# Patient Record
Sex: Female | Born: 1955 | Hispanic: Yes | Marital: Married | Smoking: Never smoker
Health system: Southern US, Community
[De-identification: ages and names within clinical notes are randomized; demographics above are authoritative.]

## PROBLEM LIST (undated history)

## (undated) DIAGNOSIS — M1990 Unspecified osteoarthritis, unspecified site: Secondary | ICD-10-CM

## (undated) DIAGNOSIS — M069 Rheumatoid arthritis, unspecified: Secondary | ICD-10-CM

## (undated) DIAGNOSIS — H547 Unspecified visual loss: Secondary | ICD-10-CM

## (undated) HISTORY — PX: BREAST BIOPSY: SHX20

## (undated) HISTORY — PX: TUBAL LIGATION: SHX77

## (undated) HISTORY — PX: CHOLECYSTECTOMY: SHX55

---

## 2005-07-09 ENCOUNTER — Emergency Department: Payer: Self-pay | Admitting: Emergency Medicine

## 2005-07-10 ENCOUNTER — Ambulatory Visit: Payer: Self-pay | Admitting: Emergency Medicine

## 2005-07-25 ENCOUNTER — Ambulatory Visit: Payer: Self-pay | Admitting: General Surgery

## 2007-04-03 ENCOUNTER — Emergency Department: Payer: Self-pay | Admitting: Emergency Medicine

## 2007-04-04 ENCOUNTER — Other Ambulatory Visit: Payer: Self-pay

## 2008-02-26 ENCOUNTER — Ambulatory Visit: Payer: Self-pay | Admitting: Family Medicine

## 2008-03-04 ENCOUNTER — Ambulatory Visit: Payer: Self-pay | Admitting: Family Medicine

## 2009-05-25 ENCOUNTER — Ambulatory Visit: Payer: Self-pay | Admitting: Family Medicine

## 2011-10-19 ENCOUNTER — Ambulatory Visit: Payer: Self-pay | Admitting: Family Medicine

## 2012-12-24 ENCOUNTER — Ambulatory Visit: Payer: Self-pay | Admitting: Family Medicine

## 2014-04-01 ENCOUNTER — Ambulatory Visit: Payer: Self-pay | Admitting: Family Medicine

## 2015-04-20 ENCOUNTER — Other Ambulatory Visit: Payer: Self-pay | Admitting: Family Medicine

## 2015-04-20 DIAGNOSIS — Z1239 Encounter for other screening for malignant neoplasm of breast: Secondary | ICD-10-CM

## 2015-05-17 ENCOUNTER — Encounter: Payer: Self-pay | Admitting: Emergency Medicine

## 2015-05-17 ENCOUNTER — Emergency Department: Payer: Medicaid Other

## 2015-05-17 ENCOUNTER — Emergency Department
Admission: EM | Admit: 2015-05-17 | Discharge: 2015-05-17 | Disposition: A | Discharge status: Home / Self Care | Payer: Medicaid Other | Attending: Emergency Medicine | Admitting: Emergency Medicine

## 2015-05-17 DIAGNOSIS — R42 Dizziness and giddiness: Secondary | ICD-10-CM | POA: Diagnosis present

## 2015-05-17 DIAGNOSIS — J069 Acute upper respiratory infection, unspecified: Secondary | ICD-10-CM

## 2015-05-17 HISTORY — DX: Unspecified osteoarthritis, unspecified site: M19.90

## 2015-05-17 HISTORY — DX: Unspecified visual loss: H54.7

## 2015-05-17 LAB — URINALYSIS COMPLETE WITH MICROSCOPIC (ARMC ONLY)
BILIRUBIN URINE: NEGATIVE
Bacteria, UA: NONE SEEN
Glucose, UA: NEGATIVE mg/dL
HGB URINE DIPSTICK: NEGATIVE
KETONES UR: NEGATIVE mg/dL
Nitrite: NEGATIVE
PH: 5 (ref 5.0–8.0)
Protein, ur: NEGATIVE mg/dL
Specific Gravity, Urine: 1.01 (ref 1.005–1.030)

## 2015-05-17 LAB — LIPASE, BLOOD: LIPASE: 31 U/L (ref 11–51)

## 2015-05-17 LAB — COMPREHENSIVE METABOLIC PANEL
ALBUMIN: 4.3 g/dL (ref 3.5–5.0)
ALT: 19 U/L (ref 14–54)
ANION GAP: 7 (ref 5–15)
AST: 20 U/L (ref 15–41)
Alkaline Phosphatase: 83 U/L (ref 38–126)
BUN: 18 mg/dL (ref 6–20)
CHLORIDE: 108 mmol/L (ref 101–111)
CO2: 25 mmol/L (ref 22–32)
CREATININE: 0.49 mg/dL (ref 0.44–1.00)
Calcium: 9 mg/dL (ref 8.9–10.3)
Glucose, Bld: 152 mg/dL — ABNORMAL HIGH (ref 65–99)
Potassium: 3.6 mmol/L (ref 3.5–5.1)
SODIUM: 140 mmol/L (ref 135–145)
Total Bilirubin: 0.5 mg/dL (ref 0.3–1.2)
Total Protein: 7.7 g/dL (ref 6.5–8.1)

## 2015-05-17 LAB — CBC
HCT: 42.4 % (ref 35.0–47.0)
HEMOGLOBIN: 14.5 g/dL (ref 12.0–16.0)
MCH: 32.1 pg (ref 26.0–34.0)
MCHC: 34.1 g/dL (ref 32.0–36.0)
MCV: 94.3 fL (ref 80.0–100.0)
PLATELETS: 296 10*3/uL (ref 150–440)
RBC: 4.5 MIL/uL (ref 3.80–5.20)
RDW: 13.2 % (ref 11.5–14.5)
WBC: 7.6 10*3/uL (ref 3.6–11.0)

## 2015-05-17 MED ORDER — MECLIZINE HCL 32 MG PO TABS: 32.0000 mg | ORAL_TABLET | Freq: Three times a day (TID) | ORAL | Status: AC | PRN

## 2015-05-17 MED ORDER — MECLIZINE HCL 25 MG PO TABS
25.0000 mg | ORAL_TABLET | Freq: Once | ORAL | Status: AC
  Administered 2015-05-17: 25 mg via ORAL
  Filled 2015-05-17: qty 1

## 2015-05-17 MED ORDER — ONDANSETRON 4 MG PO TBDP
4.0000 mg | ORAL_TABLET | Freq: Once | ORAL | Status: AC | PRN
  Administered 2015-05-17: 4 mg via ORAL

## 2015-05-17 MED ORDER — ONDANSETRON HCL 4 MG/2ML IJ SOLN
4.0000 mg | Freq: Once | INTRAMUSCULAR | Status: AC
  Administered 2015-05-17: 4 mg via INTRAVENOUS
  Filled 2015-05-17: qty 2

## 2015-05-17 MED ORDER — ONDANSETRON 4 MG PO TBDP
ORAL_TABLET | ORAL | Status: AC
  Filled 2015-05-17: qty 1

## 2015-05-17 MED ORDER — SODIUM CHLORIDE 0.9 % IV BOLUS (SEPSIS)
1000.0000 mL | Freq: Once | INTRAVENOUS | Status: AC
  Administered 2015-05-17: 1000 mL via INTRAVENOUS

## 2015-05-17 MED ORDER — ONDANSETRON HCL 4 MG PO TABS: 4.0000 mg | ORAL_TABLET | Freq: Every day | ORAL | Status: AC | PRN

## 2015-05-17 NOTE — ED Notes (Signed)
Patient states that she is having nausea, dizziness, and "heaviness" of her body for the past 5 days, symptoms were worse on Saturday. Patient feels a "ball" forming in the epigastric area and she starts to feel nauseated.

## 2015-05-17 NOTE — ED Notes (Signed)
Patient given 12 oz of water and crackers.

## 2015-05-17 NOTE — ED Notes (Signed)
Patient able to ambulate from room to nurses station and back with room without trouble. Patient reports minimal dizziness.   Patient denies nausea since PO intake.

## 2015-05-17 NOTE — ED Provider Notes (Addendum)
[facility] Emergency Department Provider Note  ____________________________________________   I have reviewed the triage vital signs and the nursing notes.   HISTORY  Chief Complaint Dizziness    HPI Florence Yeung Ballowe is a 59 y.o. female with a history of recurrent vertigo in her life, 3 or 4 different episodes has what she describes as vertigo. She states she has a sensation of spinning which is been going on for the last several days. She denies any fever or chills or focal numbness or weakness. Patient is blind at baseline. She states her stomach has been feeling somewhat upset and she feels generally weak. She states that the nausea and vertigo symptoms have been waxing and waning they are worse when she changes position in a hurry. She denies any fever or chills. She does have a cough and the cough she started several days before the vertigo. She denies production of mucus. Positive rhinorrhea denies fever as been able to eat and drink normally including right before she came in, denies any constipation or diarrhea or change in her bowel habits and has not actually vomited.  Past Medical History  Diagnosis Date  . Arthritis   . Blind     There are no active problems to display for this patient.   Past Surgical History  Procedure Laterality Date  . Cholecystectomy    . Tubal ligation      No current outpatient prescriptions on file.  Allergies Review of patient's allergies indicates no known allergies.  No family history on file.  Social History Social History  Substance Use Topics  . Smoking status: Not on file  . Smokeless tobacco: Not on file  . Alcohol Use: Not on file    Review of Systems Constitutional: No fever/chills Eyes: No visual changes. ENT: No sore throat. No stiff neck no neck pain Cardiovascular: Denies chest pain. Respiratory: Denies shortness of breath. Gastrointestinal:   no vomiting.  No diarrhea.  No  constipation. Genitourinary: Negative for dysuria. Musculoskeletal: Negative lower extremity swelling Skin: Negative for rash. Neurological: Negative for headaches, focal weakness or numbness. 10-point ROS otherwise negative.  ____________________________________________   PHYSICAL EXAM:  VITAL SIGNS: ED Triage Vitals  Enc Vitals Group     BP 05/17/15 1914 140/73 mmHg     Pulse Rate 05/17/15 1914 78     Resp 05/17/15 1914 22     Temp 05/17/15 1914 98.6 F (37 C)     Temp Source 05/17/15 1914 Oral     SpO2 05/17/15 1914 96 %     Weight 05/17/15 1914 187 lb (84.823 kg)     Height --      Head Cir --      Peak Flow --      Pain Score 05/17/15 1915 3     Pain Loc --      Pain Edu? --      Excl. in GC? --     Constitutional: Alert and oriented. Well appearing and in no acute distress. Eyes: Conjunctivae are normal. . patient will not follow my finger given complete A saline blindness and I cannot evaluate for nystagmus. Head: Atraumatic. Nose: No congestion/rhinnorhea. Mouth/Throat: Mucous membranes are moist.  Oropharynx non-erythematous. Neck: No stridor.   Nontender with no meningismus Cardiovascular: Normal rate, regular rhythm. Grossly normal heart sounds.  Good peripheral circulation. Respiratory: Normal respiratory effort.  No retractions. Lungs CTAB. Abdominal: Soft and nontender. No distention. No guarding no rebound Back:  There is no focal  tenderness or step off there is no midline tenderness there are no lesions noted. there is no CVA tenderness Musculoskeletal: No lower extremity tenderness. No joint effusions, no DVT signs strong distal pulses no edema Neurologic:  Normal speech and language. No gross focal neurologic deficits Appreciated I cannot evaluate for finger to nose, however rapid alternating hand movements and heel-to-shin appear normal. There is no pronator drift and cranial nerves are grossly intact.  Skin:  Skin is warm, dry and intact. No rash  noted. Psychiatric: Mood and affect are normal. Speech and behavior are normal.  ____________________________________________   LABS (all labs ordered are listed, but only abnormal results are displayed)  Labs Reviewed  COMPREHENSIVE METABOLIC PANEL - Abnormal; Notable for the following:    Glucose, Bld 152 (*)    All other components within normal limits  URINALYSIS COMPLETEWITH MICROSCOPIC ([facility] ONLY) - Abnormal; Notable for the following:    Color, Urine STRAW (*)    APPearance CLEAR (*)    Leukocytes, UA 1+ (*)    Squamous Epithelial / LPF 0-5 (*)    All other components within normal limits  LIPASE, BLOOD  CBC   ____________________________________________  EKG  I personally interpreted any EKGs ordered by me or triage  ____________________________________________  RADIOLOGY  I reviewed any imaging ordered by me or triage that were performed during my shift ____________________________________________   PROCEDURES  Procedure(s) performed: None  Critical Care performed: None  ____________________________________________   INITIAL IMPRESSION / ASSESSMENT AND PLAN / ED COURSE  Pertinent labs & imaging results that were available during my care of the patient were reviewed by me and considered in my medical decision making (see chart for details).   Miss Bradly BienenstockMartinez suffered from recurrent vertigo many times in her life and she is having attack of vertigo at the same time she is having a URI symptoms. Her TMs at this time are normal. I do suspect this is most likely an inner ear issue given the recurrent nature of her symptoms and the otherwise absence of cerebellar symptoms however she has not had imaging she states we'll obtain a CT scan of her head as a precaution she has had symptoms for 4 days. I will give her Antivert and fluids and nausea medication and we will reassess. Given her cough I'll also obtain a chest x-ray although her oxygen saturation when I'm in  the room is 98% and her lungs sound clear at this time. If I can get her symptomatically feeling better with a negative CT is my hope he can get her home. Blood work is reassuring.   ----------------------------------------- 9:32 PM on 05/17/2015 -----------------------------------------  The patient states she feels much better. She has much less dizzy. She would like to try by mouth and ambulation  ----------------------------------------- 9:53 PM on 05/17/2015 -----------------------------------------  Pt ambulates with no difficulty. Eager to go home. Return precautions and f/u stressed in spanish and understood.   ___________________________________________   FINAL CLINICAL IMPRESSION(S) / ED DIAGNOSES  Final diagnoses:  None     Jeanmarie PlantJames A Rula [place], MD 05/17/15 2047  Jeanmarie PlantJames A Hansen Carino, MD 05/17/15 2133  Jeanmarie PlantJames A Lorain Fettes, MD 05/17/15 2153

## 2015-05-17 NOTE — ED Notes (Addendum)
pt reports woke up on thursday and when getting out of the bed the room was spinning and nausea, she went to her pcp last month with back pain and chest pain and was prescribed fluloxetine she took it on Wednesday night and states current symptoms started the following morning, was the first time taking this new RX, pt also states cramping in stomach and makes gives her nausea

## 2015-05-31 ENCOUNTER — Ambulatory Visit
Admission: RE | Admit: 2015-05-31 | Discharge: 2015-05-31 | Disposition: A | Discharge status: Home / Self Care | Payer: Medicaid Other | Source: Ambulatory Visit | Attending: Family Medicine | Admitting: Family Medicine

## 2015-05-31 DIAGNOSIS — Z1231 Encounter for screening mammogram for malignant neoplasm of breast: Secondary | ICD-10-CM | POA: Insufficient documentation

## 2015-05-31 DIAGNOSIS — Z1239 Encounter for other screening for malignant neoplasm of breast: Secondary | ICD-10-CM

## 2015-06-19 ENCOUNTER — Emergency Department
Admission: EM | Admit: 2015-06-19 | Discharge: 2015-06-19 | Disposition: A | Discharge status: Home / Self Care | Payer: Medicaid Other | Attending: Emergency Medicine | Admitting: Emergency Medicine

## 2015-06-19 ENCOUNTER — Emergency Department: Payer: Medicaid Other

## 2015-06-19 ENCOUNTER — Encounter: Payer: Self-pay | Admitting: Radiology

## 2015-06-19 DIAGNOSIS — H9203 Otalgia, bilateral: Secondary | ICD-10-CM | POA: Diagnosis not present

## 2015-06-19 DIAGNOSIS — R6884 Jaw pain: Secondary | ICD-10-CM | POA: Diagnosis present

## 2015-06-19 DIAGNOSIS — K0889 Other specified disorders of teeth and supporting structures: Secondary | ICD-10-CM | POA: Diagnosis not present

## 2015-06-19 DIAGNOSIS — K047 Periapical abscess without sinus: Secondary | ICD-10-CM | POA: Diagnosis not present

## 2015-06-19 DIAGNOSIS — J029 Acute pharyngitis, unspecified: Secondary | ICD-10-CM | POA: Diagnosis not present

## 2015-06-19 LAB — COMPREHENSIVE METABOLIC PANEL
ALBUMIN: 3.8 g/dL (ref 3.5–5.0)
ALT: 21 U/L (ref 14–54)
ANION GAP: 6 (ref 5–15)
AST: 24 U/L (ref 15–41)
Alkaline Phosphatase: 81 U/L (ref 38–126)
BUN: 13 mg/dL (ref 6–20)
CHLORIDE: 107 mmol/L (ref 101–111)
CO2: 27 mmol/L (ref 22–32)
Calcium: 9 mg/dL (ref 8.9–10.3)
Creatinine, Ser: 0.72 mg/dL (ref 0.44–1.00)
GFR calc Af Amer: >60 mL/min (ref >60)
GFR calc non Af Amer: >60 mL/min (ref >60)
GLUCOSE: 110 mg/dL — AB (ref 65–99)
POTASSIUM: 4 mmol/L (ref 3.5–5.1)
Sodium: 140 mmol/L (ref 135–145)
Total Bilirubin: 0.7 mg/dL (ref 0.3–1.2)
Total Protein: 7.6 g/dL (ref 6.5–8.1)

## 2015-06-19 LAB — CBC WITH DIFFERENTIAL/PLATELET
BASOS ABS: 0 10*3/uL (ref 0–0.1)
Basophils Relative: 0 %
EOS PCT: 3 %
Eosinophils Absolute: 0.3 10*3/uL (ref 0–0.7)
HEMATOCRIT: 39.1 % (ref 35.0–47.0)
Hemoglobin: 13.1 g/dL (ref 12.0–16.0)
LYMPHS ABS: 1.4 10*3/uL (ref 1.0–3.6)
LYMPHS PCT: 17 %
MCH: 31.2 pg (ref 26.0–34.0)
MCHC: 33.5 g/dL (ref 32.0–36.0)
MCV: 93.1 fL (ref 80.0–100.0)
Monocytes Absolute: 0.5 10*3/uL (ref 0.2–0.9)
Monocytes Relative: 6 %
NEUTROS ABS: 6.2 10*3/uL (ref 1.4–6.5)
Neutrophils Relative %: 74 %
PLATELETS: 279 10*3/uL (ref 150–440)
RBC: 4.2 MIL/uL (ref 3.80–5.20)
RDW: 13 % (ref 11.5–14.5)
WBC: 8.4 10*3/uL (ref 3.6–11.0)

## 2015-06-19 LAB — TSH: TSH: 2.795 u[IU]/mL (ref 0.350–4.500)

## 2015-06-19 MED ORDER — HYDROMORPHONE HCL 1 MG/ML IJ SOLN
0.5000 mg | Freq: Once | INTRAMUSCULAR | Status: AC
  Administered 2015-06-19: 0.5 mg via INTRAVENOUS
  Filled 2015-06-19: qty 1

## 2015-06-19 MED ORDER — IOHEXOL 300 MG/ML  SOLN
75.0000 mL | Freq: Once | INTRAMUSCULAR | Status: AC | PRN
  Administered 2015-06-19: 75 mL via INTRAVENOUS
  Filled 2015-06-19: qty 75

## 2015-06-19 MED ORDER — AMOXICILLIN 500 MG PO CAPS
500.0000 mg | ORAL_CAPSULE | Freq: Once | ORAL | Status: AC
  Administered 2015-06-19: 500 mg via ORAL
  Filled 2015-06-19: qty 1

## 2015-06-19 MED ORDER — OXYCODONE-ACETAMINOPHEN 5-325 MG PO TABS: 1.0000 | ORAL_TABLET | ORAL | Status: AC | PRN

## 2015-06-19 MED ORDER — AMOXICILLIN 500 MG PO TABS: 500.0000 mg | ORAL_TABLET | Freq: Two times a day (BID) | ORAL | Status: AC

## 2015-06-19 NOTE — ED Provider Notes (Signed)
[facility] Emergency Department Provider Note  ____________________________________________  Time seen: Approximately 4:43 PM  I have reviewed the triage vital signs and the nursing notes. History and physical exam are obtained via interpreter, Trudy.   HISTORY  Chief Complaint Sore Throat    HPI Morgan Carroll is a 60 y.o. female presents for evaluation of severe throat and bilateral ear pain. Patient states that her face history swelling at the wrist or swelling her jaws or swelling and pain is 10 over 10. No relief with anything over-the-counter symptoms progressively getting worse. This started gradually yesterday.Patient is legally blind, speaks no [place].  Past Medical History  Diagnosis Date  . Arthritis   . Blind     There are no active problems to display for this patient.   Past Surgical History  Procedure Laterality Date  . Cholecystectomy    . Tubal ligation      Current Outpatient Rx  Name  Route  Sig  Dispense  Refill  . amoxicillin (AMOXIL) 500 MG tablet   Oral   Take 1 tablet (500 mg total) by mouth 2 (two) times daily.   20 tablet   0   . meclizine (ANTIVERT) 32 MG tablet   Oral   Take 1 tablet (32 mg total) by mouth 3 (three) times daily as needed.   30 tablet   0   . ondansetron (ZOFRAN) 4 MG tablet   Oral   Take 1 tablet (4 mg total) by mouth daily as needed for nausea or vomiting.   10 tablet   0   . oxyCODONE-acetaminophen (ROXICET) 5-325 MG tablet   Oral   Take 1-2 tablets by mouth every 4 (four) hours as needed for severe pain.   15 tablet   0     Allergies Review of patient's allergies indicates no known allergies.  No family history on file.  Social History Social History  Substance Use Topics  . Smoking status: None  . Smokeless tobacco: None  . Alcohol Use: None    Review of Systems Constitutional: No fever/chills Eyes: No visual changes. ENT: Positive for sore throat more  positive for jaw pain and dental pain. Recent dental pain. Cardiovascular: Denies chest pain. Respiratory: Denies shortness of breath. Gastrointestinal: No abdominal pain.  No nausea, no vomiting.  No diarrhea.  No constipation. Genitourinary: Negative for dysuria. Musculoskeletal: Negative for back pain. Skin: Negative for rash. Neurological: Negative for headaches, focal weakness or numbness.  10-point ROS otherwise negative.  ____________________________________________   PHYSICAL EXAM:  VITAL SIGNS: ED Triage Vitals  Enc Vitals Group     BP 06/19/15 1553 133/57 mmHg     Pulse Rate 06/19/15 1553 94     Resp 06/19/15 1553 16     Temp 06/19/15 1553 99.6 F (37.6 C)     Temp Source 06/19/15 1553 Oral     SpO2 06/19/15 1553 96 %     Weight 06/19/15 1553 187 lb (84.823 kg)     Height --      Head Cir --      Peak Flow --      Pain Score 06/19/15 1602 9     Pain Loc --      Pain Edu? --      Excl. in GC? --     Constitutional: Alert and oriented. Well appearing and in no acute distress. Eyes: Conjunctivae are normal. PERRL. EOMI. Head: Atraumatic. Nose: No congestion/rhinnorhea. Mouth/Throat: Mucous membranes are moist.  Oropharynx  non-erythematous. Increased edema and tenderness to the bilateral jaw and inferior neck and around the thyroid gland. Neck: No stridor. Positive subcutaneous mass around the thyroid gland measuring approximately 3 cm.   Cardiovascular: Normal rate, regular rhythm. Grossly normal heart sounds.  Good peripheral circulation. Respiratory: Normal respiratory effort.  No retractions. Lungs CTAB. Musculoskeletal: No lower extremity tenderness nor edema.  No joint effusions. Neurologic:  Normal speech and language. No gross focal neurologic deficits are appreciated. No gait instability. Skin:  Skin is warm, dry and intact. No rash noted. Psychiatric: Mood and affect are normal. Speech and behavior are  normal.  ____________________________________________   LABS (all labs ordered are listed, but only abnormal results are displayed)  Labs Reviewed  COMPREHENSIVE METABOLIC PANEL - Abnormal; Notable for the following:    Glucose, Bld 110 (*)    All other components within normal limits  CBC WITH DIFFERENTIAL/PLATELET  TSH   RADIOLOGY No acute abscess noted.  ____________________________________________   PROCEDURES  Procedure(s) performed: None  Critical Care performed: No  ____________________________________________   INITIAL IMPRESSION / ASSESSMENT AND PLAN / ED COURSE  Pertinent labs & imaging results that were available during my care of the patient were reviewed by me and considered in my medical decision making (see chart for details).  Dental pain with infection. Rx given for amoxicillin 500 mg 3 times a day, Percocet 5/325. Patient follow-up with dentist, list provided. Patient voices no other emergency medical complaints at this time. ____________________________________________   FINAL CLINICAL IMPRESSION(S) / ED DIAGNOSES  Final diagnoses:  Tooth ache  Dental infection      Evangeline Dakin, PA-C 06/19/15 1924  Jene Every, MD 06/19/15 2218

## 2015-06-19 NOTE — ED Notes (Signed)
Pt discharged to home.  Family member driving. Interpreter present for discharge instructions.   Discharge instructions reviewed.  Verbalized understanding.  No questions or concerns at this time.  Teach back verified.  Pt in NAD.  No items left in ED.

## 2015-06-19 NOTE — ED Notes (Signed)
Pt reports sore throat and bilateral ear pain  for pat couple of days. Was seen by her PCP yesterday and was told it was a virus but given Tylenol and Doxycycline prescription that she has with her. Pt reports taken one does so far was told to come to the ER if the pain has gotten worse which it has per pt.

## 2015-06-19 NOTE — Discharge Instructions (Signed)
Dolor dental (Dental Pain) Las causas del dolor dental pueden ser muchas, entre ellas, las siguientes:  Caries. La caries hace que el nervio del diente est expuesto al aire y a las temperaturas calientes o fras. Esto puede causar dolor o molestias.  Infeccin o absceso. Un absceso dental es un rea llena de pus infectado debido a una infeccin bacteriana en la parte interna del diente (pulpa). Por lo general, se produce en el extremo de la raz del diente.  Lesiones.  Una razn desconocida (idioptica). El dolor puede ser leve o intenso. El dolor quizs aparezca solamente en estas ocasiones:  Al masticar.  Al estar expuesto a una temperatura caliente o fra.  Al comer alimentos o tomar bebidas con azcar, por ejemplo, gaseosas o caramelos. El dolor tambin puede ser constante. INSTRUCCIONES PARA EL CUIDADO EN EL HOGAR Controle el dolor dental a fin de Public house manager cambio. Las siguientes medidas pueden servir para Paramedic cualquier molestia que est sintiendo:  Tome los medicamentos solamente como se lo haya indicado el dentista.  Si le recetaron antibiticos, asegrese de terminarlos, incluso si comienza a Actor.  Concurra a todas las visitas de control como se lo haya indicado el dentista. Esto es importante.  No se aplique calor en la parte externa de la cara.  Si el dentista se lo ha indicado, enjuguese la boca o haga grgaras con agua salada. Esto ayuda a Engineer, materials y Building services engineer.  Puede preparar agua salada agregando  de cucharadita de sal en 1taza de agua templada.  Aplquese hielo sobre la zona dolorida de la cara:  Ponga el hielo en una bolsa plstica.  Coloque una toalla entre la piel y la bolsa de hielo.  Coloque el hielo durante , 2 a 3veces por Futures trader.  Evite los alimentos o las bebidas que le causen [place], como los siguientes:  [place] o bebidas muy calientes o muy fros.  Alimentos o bebidas dulces o con  azcar. SOLICITE ATENCIN MDICA SI:  El dolor no se alivia con los [employer].  Los sntomas empeoran.  Aparecen nuevos sntomas. SOLICITE ATENCIN MDICA DE INMEDIATO SI:  No puede abrir la boca.  Tiene problemas para respirar o tragar.  Tiene fiebre.  Tiene hinchazn en la cara, el cuello o la mandbula.  OPTIONS FOR DENTAL FOLLOW UP CARE   Department of Health and Human Services - Local Safety Net Dental Clinics [email or web address].[email or web address].htm   [facility] [phone])  [facility] [phone])  [place] [phone] ext [phone])  [facility] [phone])  [facility] [phone]) This clinic caters to the indigent population and is on a lottery system. Location: [employer] [facility], [employer], [address] [place] Clinic Hours: Wednesdays from 6pm - 9pm, patients seen by a lottery system. For dates, call or go to [email or web address] Services: Cleanings, fillings and simple extractions. Payment Options: DENTAL WORK IS FREE OF CHARGE. Bring proof of income or support. Best way to get seen: Arrive at 5:15 pm - this is a lottery, NOT first come/first serve, so arriving earlier will not increase your chances of being seen.     [facility] [phone] Select option 1 for emergencies   Location: [facility], [place], [address] [place] Clinic Hours: No walk-ins accepted - call the day before to schedule an appointment. Check in times are 9:30 am and 1:30 pm. Services: Simple extractions, temporary fillings, pulpectomy/pulp debridement, uncomplicated abscess drainage.  Payment Options: PAYMENT IS DUE AT THE TIME OF SERVICE.  Fee is usually $100-200, additional surgical procedures (e.g. abscess drainage) may be extra. Cash, checks, Visa/MasterCard accepted.  Can  file Medicaid if patient is covered for dental - patient should call case worker to check. No discount for [facility] patients. Best way to get seen: MUST call the day before and get onto the schedule. Can usually be seen the next 1-2 days. No walk-ins accepted.     [facility] [phone]   Location: [facility], [address], [place] Clinic Hours: M, W, Th, F 8am or 1:30pm, Tues 9a or 1:30 - first come/first served. Services: Simple extractions, temporary fillings, uncomplicated abscess drainage.  You do not need to be an [facility] resident. Payment Options: PAYMENT IS DUE AT THE TIME OF SERVICE. Dental insurance, otherwise sliding scale - bring proof of income or support. Depending on income and treatment needed, cost is usually $50-200. Best way to get seen: Arrive early as it is first come/first served.     [facility] [phone]   Location: [address] Clinic Hours: Mon-Thu 8a-5p Services: Most basic dental services including extractions and fillings. Payment Options: PAYMENT IS DUE AT THE TIME OF SERVICE. Sliding scale, up to 50% off - bring proof if income or support. Medicaid with dental option accepted. Best way to get seen: Call to schedule an appointment, can usually be seen within 2 weeks OR they will try to see walk-ins - show up at 8a or 2p (you may have to wait).     [facility] [phone] [place] RESIDENTS ONLY   Location: [facility], [address], [place], [place] [address] Clinic Hours: By appointment only. Monday - Thursday 8am-5pm, Friday 8am-12pm Services: Cleanings, fillings, extractions. Payment Options: PAYMENT IS DUE AT THE TIME OF SERVICE. Cash, Visa or MasterCard. Sliding scale - $30 minimum per service. Best way to get seen: Come in to office, complete packet and make an appointment - need proof  of income or support monies for each household member and proof of [facility] residence. Usually takes about a month to get in.     [facility] [phone]   Location: [address], [facility] Clinic Hours: Walk-in Urgent Care Dental Services are offered Monday-Friday mornings only. The numbers of emergencies accepted daily is limited to the number of providers available. Maximum 15 - Mondays, Wednesdays & Thursdays Maximum 10 - Tuesdays & Fridays Services: You do not need to be a [facility] resident to be seen for a dental emergency. Emergencies are defined as pain, swelling, abnormal bleeding, or dental trauma. Walkins will receive x-rays if needed. NOTE: Dental cleaning is not an emergency. Payment Options: PAYMENT IS DUE AT THE TIME OF SERVICE. Minimum co-pay is $40.00 for uninsured patients. Minimum co-pay is $3.00 for Medicaid with dental coverage. Dental Insurance is accepted and must be presented at time of visit. Medicare does not cover dental. Forms of payment: Cash, credit card, checks. Best way to get seen: If not previously registered with the clinic, walk-in dental registration begins at 7:15 am and is on a first come/first serve basis. If previously registered with the clinic, call to make an appointment.     [facility] [phone] [place] RESIDENTS ONLY   Location: [address], [place], [place] Clinic Hours: Mon-Thu 10a-2p Services: Extractions only! Payment Options: FREE (donations accepted) - bring  proof of income or support Best way to get seen: Call and schedule an appointment OR come at 8am on the 1st Monday of every month (except for holidays) when it is first come/first served.     [facility] [phone]   Location: [address] [place], [place] Clinic Hours: Friday mornings Services, Payment Options, Best way to get seen: Call for info   Esta informacin no tiene como fin  reemplazar el consejo del mdico. Asegrese de hacerle al mdico cualquier pregunta que tenga.   Document Released: 05/08/2005 Document Revised: 09/22/2014 Elsevier Interactive Patient Education [employer]

## 2016-07-31 ENCOUNTER — Encounter: Payer: Self-pay | Admitting: Emergency Medicine

## 2016-07-31 ENCOUNTER — Emergency Department: Payer: Self-pay

## 2016-07-31 ENCOUNTER — Emergency Department
Admission: EM | Admit: 2016-07-31 | Discharge: 2016-07-31 | Disposition: A | Discharge status: Home / Self Care | Payer: Self-pay | Attending: Emergency Medicine | Admitting: Emergency Medicine

## 2016-07-31 DIAGNOSIS — Y939 Activity, unspecified: Secondary | ICD-10-CM | POA: Insufficient documentation

## 2016-07-31 DIAGNOSIS — S40012A Contusion of left shoulder, initial encounter: Secondary | ICD-10-CM

## 2016-07-31 DIAGNOSIS — W19XXXA Unspecified fall, initial encounter: Secondary | ICD-10-CM | POA: Insufficient documentation

## 2016-07-31 DIAGNOSIS — M545 Low back pain, unspecified: Secondary | ICD-10-CM

## 2016-07-31 DIAGNOSIS — S8012XA Contusion of left lower leg, initial encounter: Secondary | ICD-10-CM

## 2016-07-31 DIAGNOSIS — Y999 Unspecified external cause status: Secondary | ICD-10-CM | POA: Insufficient documentation

## 2016-07-31 DIAGNOSIS — M25552 Pain in left hip: Secondary | ICD-10-CM | POA: Insufficient documentation

## 2016-07-31 DIAGNOSIS — Y929 Unspecified place or not applicable: Secondary | ICD-10-CM | POA: Insufficient documentation

## 2016-07-31 DIAGNOSIS — S9031XA Contusion of right foot, initial encounter: Secondary | ICD-10-CM

## 2016-07-31 DIAGNOSIS — M25551 Pain in right hip: Secondary | ICD-10-CM | POA: Insufficient documentation

## 2016-07-31 HISTORY — DX: Rheumatoid arthritis, unspecified: M06.9

## 2016-07-31 MED ORDER — MELOXICAM 15 MG PO TABS: 15.0000 mg | ORAL_TABLET | Freq: Every day | ORAL | 0 refills | Status: AC

## 2016-07-31 MED ORDER — KETOROLAC TROMETHAMINE 30 MG/ML IJ SOLN
30.0000 mg | Freq: Once | INTRAMUSCULAR | Status: AC
  Administered 2016-07-31: 30 mg via INTRAMUSCULAR
  Filled 2016-07-31: qty 1

## 2016-07-31 NOTE — ED Triage Notes (Signed)
Fell 15 days ago. Pain lower back and down L leg since.

## 2016-07-31 NOTE — ED Provider Notes (Signed)
[facility] Emergency Department Provider Note  ____________________________________________   First MD Initiated Contact with Patient 07/31/16 1409     (approximate)  I have reviewed the triage vital signs and the nursing notes.   HISTORY  Chief Complaint Leg Injury Per Spanish interpreter   HPI Morgan Carroll is a 61 y.o. female is here with complaint of multiple muscle aches and pains after a fall that occurred at home 15 days ago. Patient states that she was standing on a stool approximately 3 feet off the floor when she fell injuring her shoulder, low back, pelvis, left leg, right shoulder and right foot. Patient has been taking Aleve infrequently without any relief of her pain. She states she has not been seen by her PCP during that 15 days after her accident. She denies any head injury or loss of consciousness. Patient has continued to ambulate without assistance at home but states the pain is worse today. She denies any previous back injury or problems with her right shoulder. She denies any peptic ulcer disease or known allergies. [facility]. She rates her pain as an 8/10.   Past Medical History:  Diagnosis Date  . Arthritis   . Blind   . Blind   . Rheumatoid arthritis (HCC)     There are no active problems to display for this patient.   Past Surgical History:  Procedure Laterality Date  . CHOLECYSTECTOMY    . TUBAL LIGATION      Prior to Admission medications   Medication Sig Start Date End Date Taking? Authorizing Provider  amoxicillin (AMOXIL) 500 MG tablet Take 1 tablet (500 mg total) by mouth 2 (two) times daily. 06/19/15   Charmayne Sheerharles M Beers, PA-C  meclizine (ANTIVERT) 32 MG tablet Take 1 tablet (32 mg total) by mouth 3 (three) times daily as needed. 05/17/15   Jeanmarie PlantJames A McShane, MD  meloxicam (MOBIC) 15 MG tablet Take 1 tablet (15 mg total) by mouth daily. 07/31/16 07/31/17  Tommi Rumpshonda L  Tigran Haynie, PA-C  ondansetron (ZOFRAN) 4 MG tablet Take 1 tablet (4 mg total) by mouth daily as needed for nausea or vomiting. 05/17/15   Jeanmarie PlantJames A McShane, MD  oxyCODONE-acetaminophen (ROXICET) 5-325 MG tablet Take 1-2 tablets by mouth every 4 (four) hours as needed for severe pain. 06/19/15   Evangeline Dakinharles M Beers, PA-C    Allergies Patient has no known allergies.  No family history on file.  Social History Social History  Substance Use Topics  . Smoking status: Never Smoker  . Smokeless tobacco: Not on file  . Alcohol use Not on file    Review of Systems Constitutional: No fever/chills Eyes: No visual changes. ENT: No trauma Cardiovascular: Denies chest pain. Respiratory: Denies shortness of breath. Gastrointestinal: No abdominal pain.  No nausea, no vomiting.  Musculoskeletal: Positive for bilateral shoulder pain, left leg, right shoulder, low back pain. Positive for right foot pain. Positive for bilateral hip pain. Skin: Negative for rash. Neurological: Negative for headaches, focal weakness or numbness.  10-point ROS otherwise negative.  ____________________________________________   PHYSICAL EXAM:  VITAL SIGNS: ED Triage Vitals  Enc Vitals Group     BP 07/31/16 1250 132/72     Pulse Rate 07/31/16 1250 71     Resp 07/31/16 1250 18     Temp 07/31/16 1250 98.6 F (37 C)     Temp Source 07/31/16 1250 Oral     SpO2 07/31/16 1250 96 %  Weight 07/31/16 1252 198 lb (89.8 kg)     Height --      Head Circumference --      Peak Flow --      Pain Score 07/31/16 1253 8     Pain Loc --      Pain Edu? --      Excl. in GC? --     Constitutional: Alert and oriented. Patient does not appear to be any acute distress. Patient was lying upside down on the stretcher stating that it increases her pain if she sits. Patient was able to get up and undress and put on a gown for her exam. Patient stood up while undressing. Patient then got back on the stretcher lying stomach side down and  continued to slip from her stomach onto her back to show the interpreter and I all the places in which she hurt. Patient bent her extremities and used both upper arms to point to each shoulder where she hurt.  She is also able to bend her knee and hip to point to her right foot. Patient continued to tell her story to the interpreter a second time. Eyes: Conjunctivae are normal. PERRL. EOMI. Head: Atraumatic. Nose: No congestion/rhinnorhea. Neck: No stridor.  No cervical tenderness on palpation posteriorly. Cardiovascular: Normal rate, regular rhythm. Grossly normal heart sounds.  Good peripheral circulation. Respiratory: Normal respiratory effort.  No retractions. Lungs CTAB. Gastrointestinal: Soft and nontender. No distention. Musculoskeletal: On examination of bilateral shoulders there is no gross deformity, no crepitus and no decrease in range of motion. There is minimal tenderness on palpation of the paravertebral muscles thoracic and lumbar spine. There is no point tenderness on palpation of the spine. On examination of the left hip there is some minimal tenderness but no decreased range of motion. No soft tissue swelling and no ecchymosis was seen. On examination of the lower extremities there is no gross deformity and patient is able to bend both knees and ankles without any difficulty. No soft tissue swelling is present and no effusion noted. There is tenderness on palpation of the right foot but no soft tissue swelling or ecchymosis was noted. Neurologic:  Normal speech and language. No gross focal neurologic deficits are appreciated. No gait instability. Skin:  Skin is warm, dry and intact. No ecchymosis, erythema or abrasions were noted. Psychiatric: Mood and affect are normal. Speech and behavior are normal.  ____________________________________________   LABS (all labs ordered are listed, but only abnormal results are displayed)  Labs Reviewed - No data to  display   RADIOLOGY Pelvis x-ray per radiologist is negative for fracture. Right foot x-ray per radiologist is negative for fracture.  I, Tommi Rumps, personally viewed and evaluated these images (plain radiographs) as part of my medical decision making, as well as reviewing the written report by the radiologist.  ___________________________________________   PROCEDURES  Procedure(s) performed: None  Procedures  Critical Care performed: No  ____________________________________________   INITIAL IMPRESSION / ASSESSMENT AND PLAN / ED COURSE  Pertinent labs & imaging results that were available during my care of the patient were reviewed by me and considered in my medical decision making (see chart for details).  Patient was given Toradol injection 30 mg IM prior to her x-rays. Patient was ambulatory in the department. She was made aware of with the interpreter that there was no fracture seen on her x-rays. She was given a prescription for meloxicam 15 mg one daily with instructions to also follow-up with her  PCP at [facility]. Patient was reassured that what she is experiencing his muscle soreness that no fractures.    ___________________________________________   FINAL CLINICAL IMPRESSION(S) / ED DIAGNOSES  Final diagnoses:  Contusion of leg, multiple sites, left, initial encounter  Contusion of right foot, initial encounter  Contusion of left shoulder, initial encounter  Acute bilateral low back pain without sciatica      NEW MEDICATIONS STARTED DURING THIS VISIT:  Discharge Medication List as of 07/31/2016  4:01 PM    START taking these medications   Details  meloxicam (MOBIC) 15 MG tablet Take 1 tablet (15 mg total) by mouth daily., Starting Mon 07/31/2016, Until Tue 07/31/2017, Print         Note:  This document was prepared using Dragon voice recognition software and may include unintentional dictation errors.    Tommi Rumps, PA-C 07/31/16 Rickey Primus    Minna Antis, MD 08/03/16 2146

## 2017-09-12 IMAGING — CR DG CHEST 2V
2 series · 2 of 2 positions shown · non-contrast
Comparison: None.

CLINICAL DATA: vertigo.

EXAM:
CHEST  2 VIEW

[chest lat]
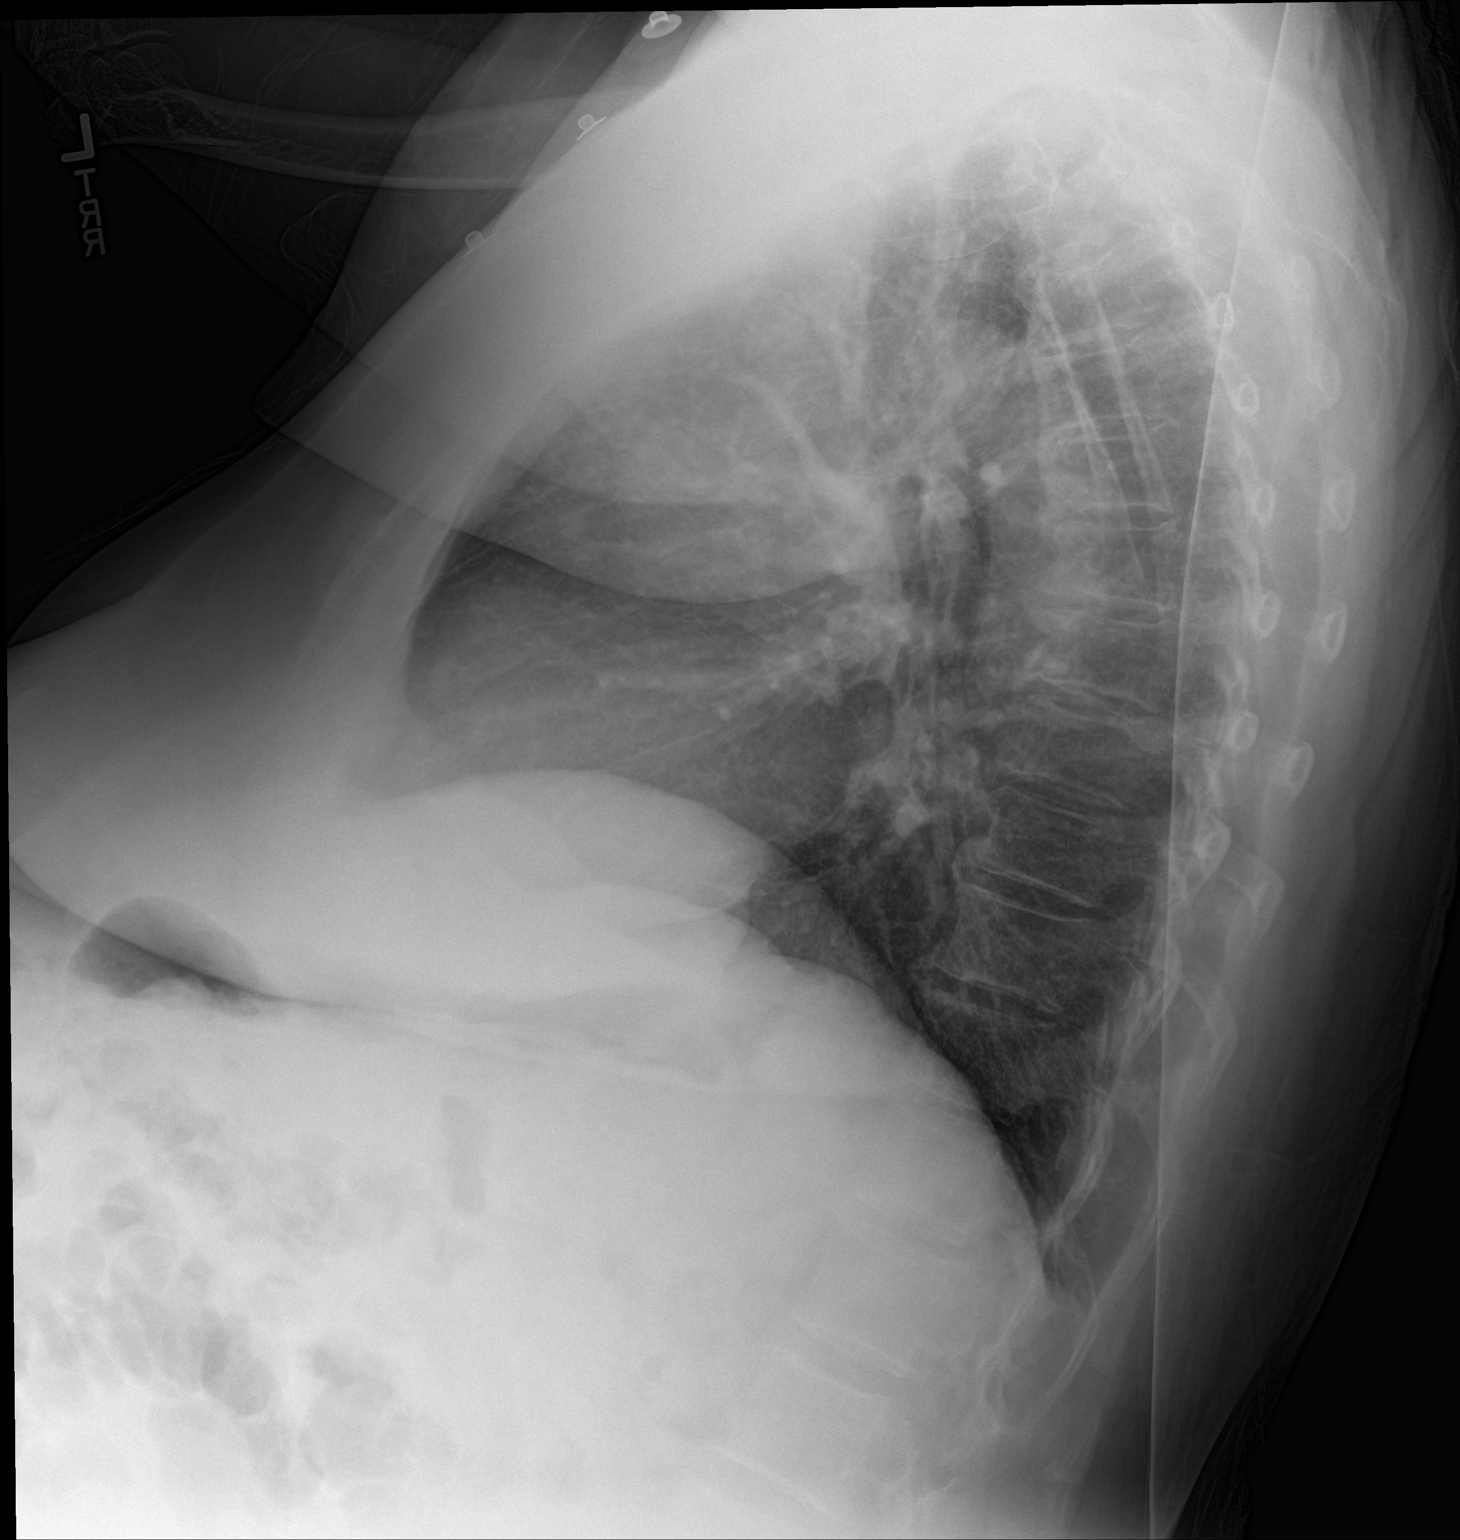

[chest ap]
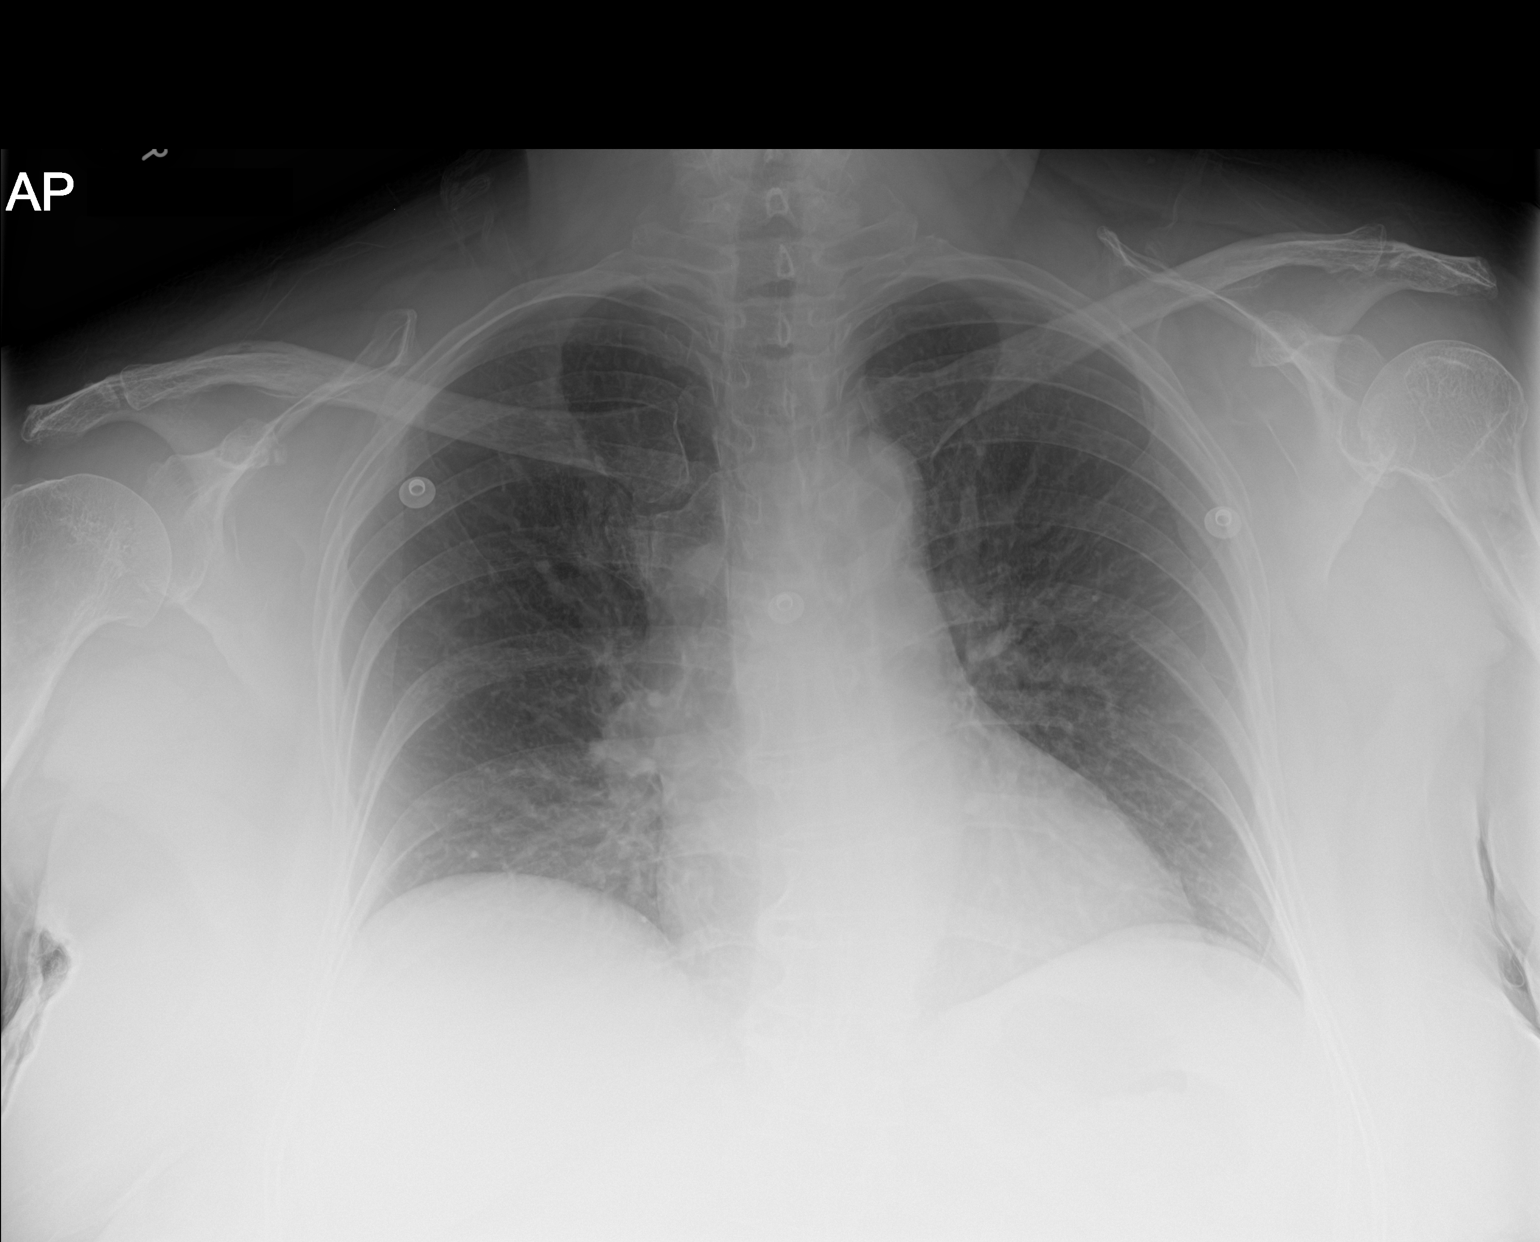

[2 of 2 positions shown; findings below may reference images not displayed]

FINDINGS: The heart size and mediastinal contours are within normal limits.
Both lungs are clear. The visualized skeletal structures are
unremarkable.
IMPRESSION: No active cardiopulmonary disease.

## 2018-04-20 ENCOUNTER — Encounter: Payer: Self-pay | Admitting: Emergency Medicine

## 2018-04-20 ENCOUNTER — Other Ambulatory Visit: Payer: Self-pay

## 2018-04-20 DIAGNOSIS — Z5321 Procedure and treatment not carried out due to patient leaving prior to being seen by health care provider: Secondary | ICD-10-CM | POA: Insufficient documentation

## 2018-04-20 DIAGNOSIS — Z760 Encounter for issue of repeat prescription: Secondary | ICD-10-CM | POA: Diagnosis present

## 2018-04-20 NOTE — ED Notes (Signed)
Pt's daughter reports that pt was told that she did not have TB but since she was starting the medication, she neede to finish it.
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
## 2018-04-21 ENCOUNTER — Emergency Department
Admission: EM | Admit: 2018-04-21 | Discharge: 2018-04-21 | Disposition: A | Discharge status: Home / Self Care | Payer: Medicare (Managed Care) | Attending: Emergency Medicine | Admitting: Emergency Medicine

## 2019-05-08 ENCOUNTER — Other Ambulatory Visit: Payer: Self-pay | Admitting: Family Medicine

## 2019-05-08 DIAGNOSIS — Z1231 Encounter for screening mammogram for malignant neoplasm of breast: Secondary | ICD-10-CM

## 2020-04-01 ENCOUNTER — Other Ambulatory Visit: Payer: Self-pay

## 2020-04-01 ENCOUNTER — Ambulatory Visit
Admission: RE | Admit: 2020-04-01 | Discharge: 2020-04-01 | Disposition: A | Discharge status: Home / Self Care | Payer: Medicaid Other | Source: Ambulatory Visit | Attending: Family Medicine | Admitting: Family Medicine

## 2020-04-01 DIAGNOSIS — Z1231 Encounter for screening mammogram for malignant neoplasm of breast: Secondary | ICD-10-CM | POA: Diagnosis not present

## 2021-01-05 ENCOUNTER — Other Ambulatory Visit: Payer: Self-pay | Admitting: Family Medicine

## 2021-01-05 DIAGNOSIS — Z1231 Encounter for screening mammogram for malignant neoplasm of breast: Secondary | ICD-10-CM

## 2021-04-04 ENCOUNTER — Ambulatory Visit
Admission: RE | Admit: 2021-04-04 | Discharge: 2021-04-04 | Disposition: A | Discharge status: Home / Self Care | Payer: No Typology Code available for payment source | Source: Ambulatory Visit | Attending: Family Medicine | Admitting: Family Medicine

## 2021-04-04 ENCOUNTER — Other Ambulatory Visit: Payer: Self-pay

## 2021-04-04 DIAGNOSIS — Z1231 Encounter for screening mammogram for malignant neoplasm of breast: Secondary | ICD-10-CM | POA: Diagnosis present

## 2022-05-12 ENCOUNTER — Other Ambulatory Visit: Payer: Self-pay | Admitting: Family Medicine

## 2022-05-12 DIAGNOSIS — Z1231 Encounter for screening mammogram for malignant neoplasm of breast: Secondary | ICD-10-CM

## 2022-08-08 ENCOUNTER — Ambulatory Visit
Admission: RE | Admit: 2022-08-08 | Discharge: 2022-08-08 | Disposition: A | Discharge status: Home / Self Care | Payer: Medicare (Managed Care) | Source: Ambulatory Visit | Attending: Family Medicine | Admitting: Family Medicine

## 2022-08-08 DIAGNOSIS — Z1231 Encounter for screening mammogram for malignant neoplasm of breast: Secondary | ICD-10-CM | POA: Diagnosis present

## 2022-10-02 ENCOUNTER — Other Ambulatory Visit: Payer: Self-pay | Admitting: Family Medicine

## 2022-10-02 DIAGNOSIS — Z1231 Encounter for screening mammogram for malignant neoplasm of breast: Secondary | ICD-10-CM

## 2023-08-01 IMAGING — MG MM DIGITAL SCREENING BILAT W/ TOMO AND CAD
8 series · 8 of 24 positions shown · non-contrast
Comparison: Previous exam(s).

CLINICAL DATA: Screening.

EXAM:
DIGITAL SCREENING BILATERAL MAMMOGRAM WITH TOMOSYNTHESIS AND CAD
TECHNIQUE: Bilateral screening digital craniocaudal and mediolateral oblique
mammograms were obtained. Bilateral screening digital breast
tomosynthesis was performed. The images were evaluated with
computer-aided detection.

[R MLO synth-2D]
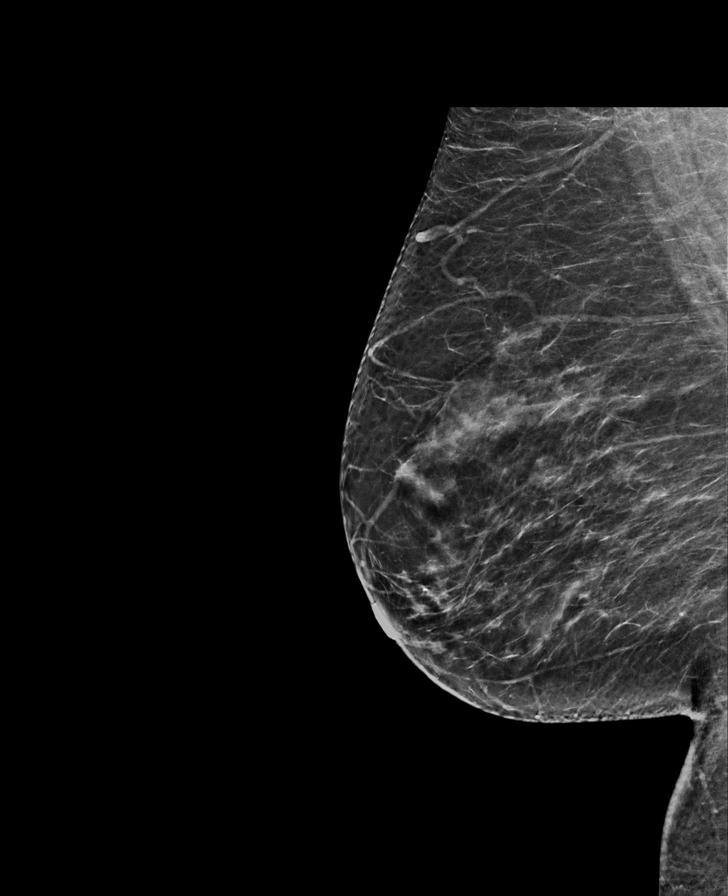

[L MLO synth-2D]
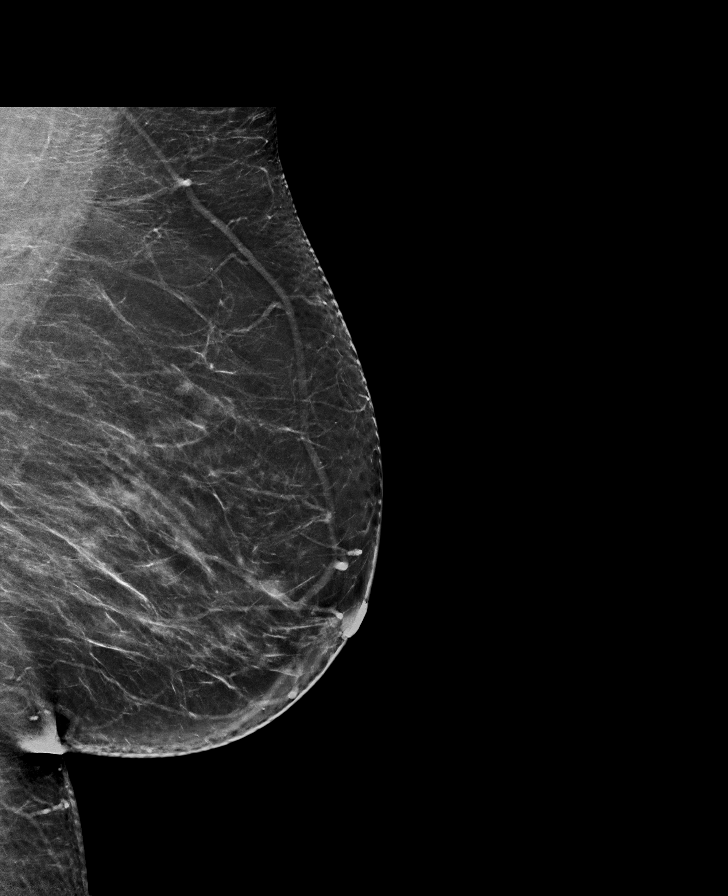

[L CC synth-2D]
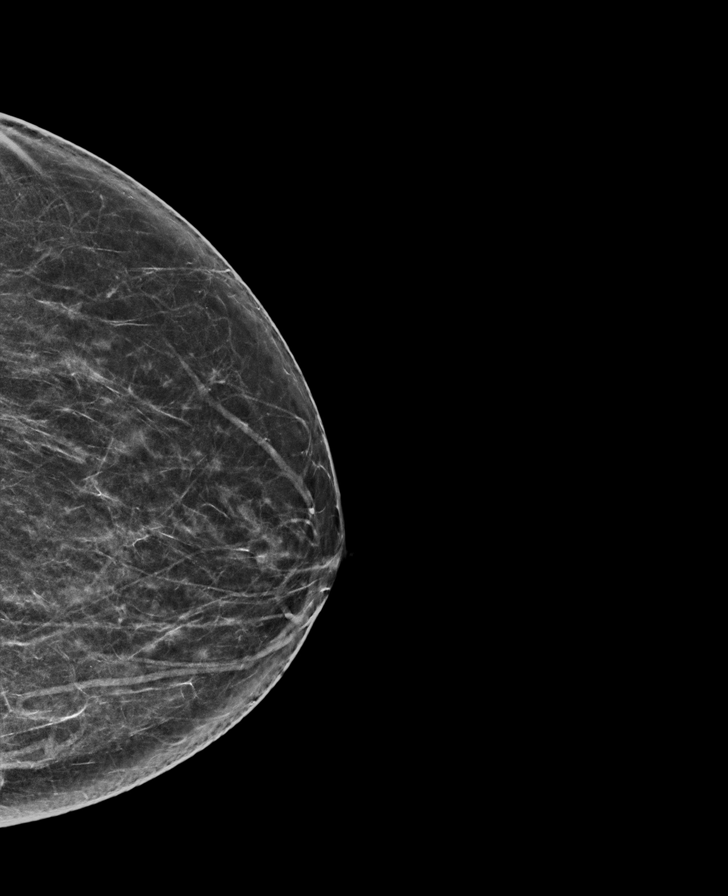

[R CC synth-2D]
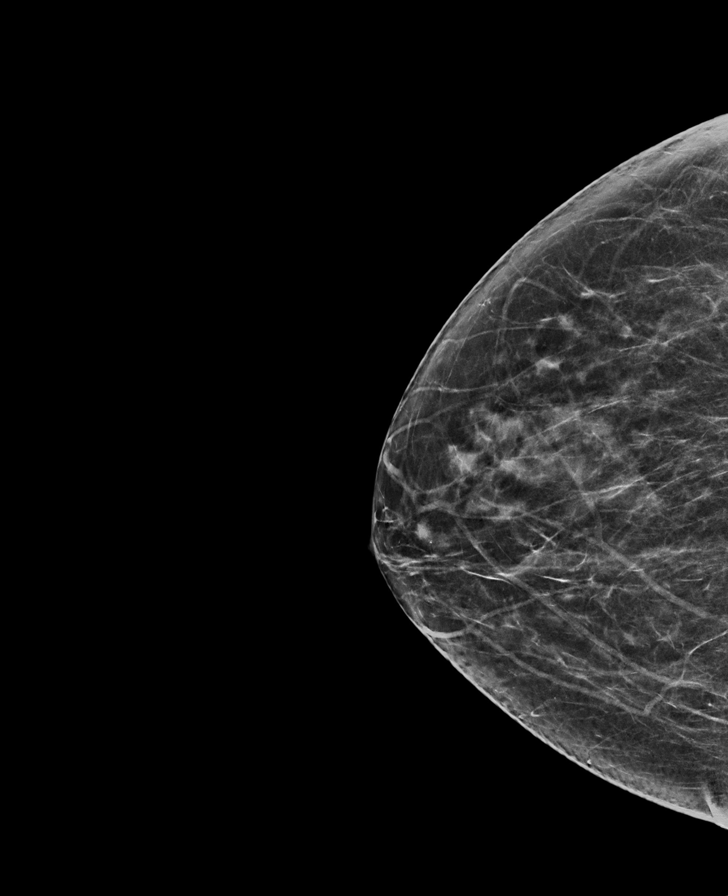

[L MLO tomo · tomo slice 39/77.0]
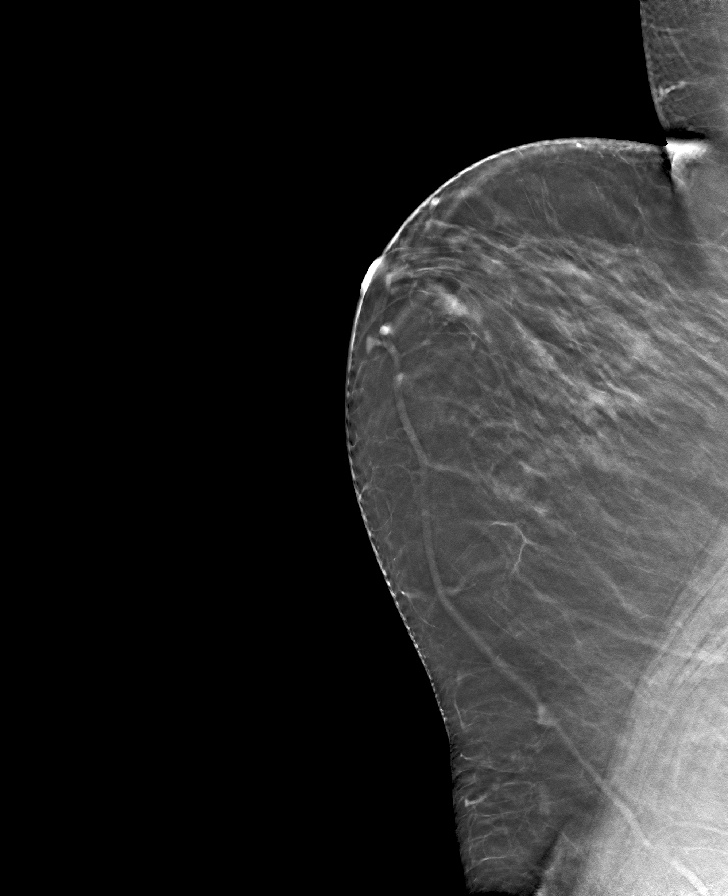

[R CC tomo · tomo slice 31/62.0]
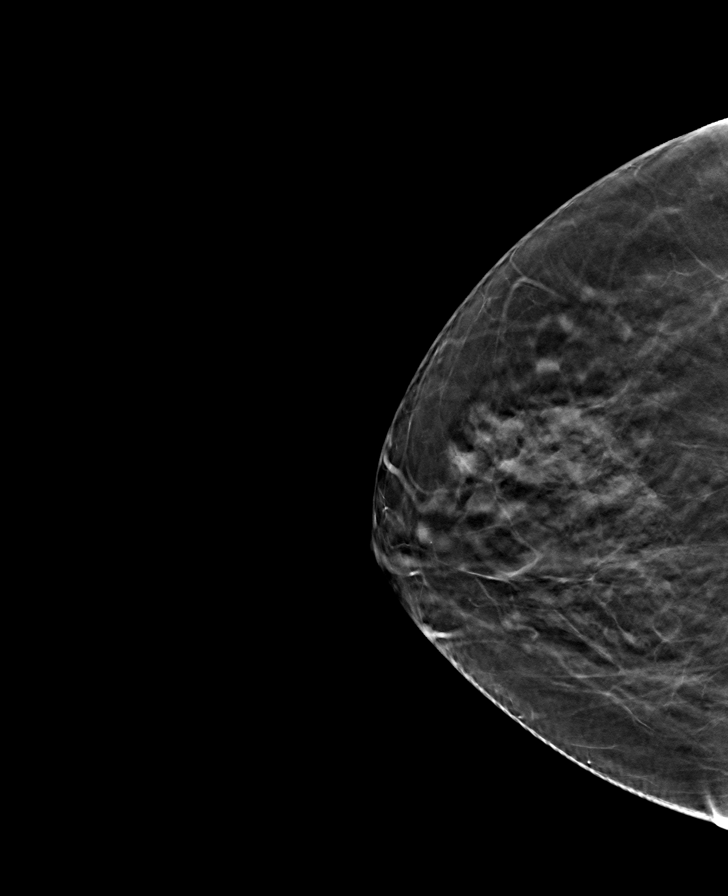

[R MLO tomo · tomo slice 37/72.0]
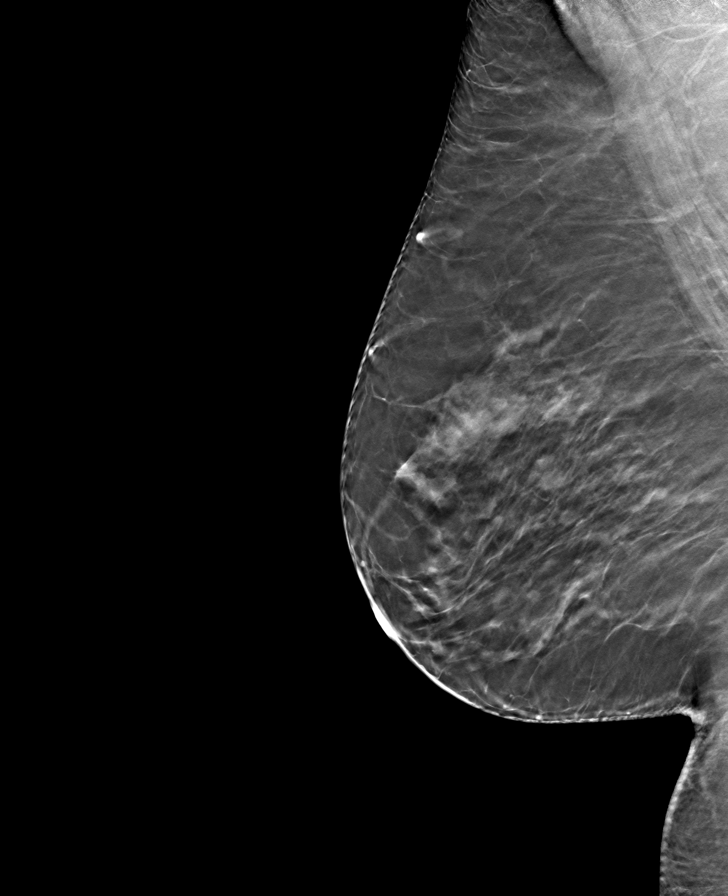

[L CC tomo · tomo slice 33/65.0]
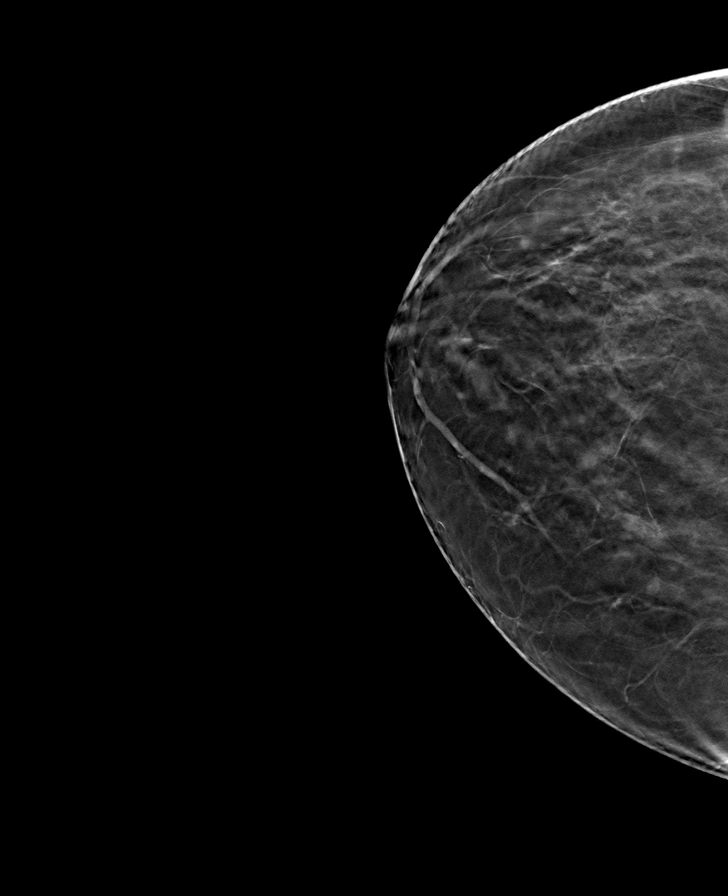

[8 of 24 positions shown; findings below may reference images not displayed]

ACR Breast Density Category c: The breast tissue is heterogeneously
dense, which may obscure small masses.
FINDINGS: There are no findings suspicious for malignancy.
IMPRESSION: No mammographic evidence of malignancy. A result letter of this
screening mammogram will be mailed directly to the patient.

RECOMMENDATION:
Screening mammogram in one year. (Code:Q3-W-BC3)

BI-RADS CATEGORY  1: Negative.

## 2023-08-09 ENCOUNTER — Ambulatory Visit
Admission: RE | Admit: 2023-08-09 | Discharge: 2023-08-09 | Disposition: A | Discharge status: Home / Self Care | Payer: Medicare (Managed Care) | Source: Ambulatory Visit | Attending: Family Medicine | Admitting: Family Medicine

## 2023-08-09 DIAGNOSIS — Z1231 Encounter for screening mammogram for malignant neoplasm of breast: Secondary | ICD-10-CM | POA: Insufficient documentation

## 2023-08-21 ENCOUNTER — Other Ambulatory Visit: Payer: Self-pay | Admitting: Family Medicine

## 2023-08-21 DIAGNOSIS — R928 Other abnormal and inconclusive findings on diagnostic imaging of breast: Secondary | ICD-10-CM

## 2023-08-27 ENCOUNTER — Ambulatory Visit
Admission: RE | Admit: 2023-08-27 | Discharge: 2023-08-27 | Disposition: A | Discharge status: Home / Self Care | Payer: Medicare (Managed Care) | Source: Ambulatory Visit | Attending: Family Medicine | Admitting: Family Medicine

## 2023-08-27 DIAGNOSIS — R928 Other abnormal and inconclusive findings on diagnostic imaging of breast: Secondary | ICD-10-CM | POA: Diagnosis present

## 2024-04-10 ENCOUNTER — Other Ambulatory Visit: Payer: Self-pay | Admitting: Family Medicine

## 2024-04-10 DIAGNOSIS — Z1231 Encounter for screening mammogram for malignant neoplasm of breast: Secondary | ICD-10-CM

## 2024-08-21 ENCOUNTER — Encounter
